# Patient Record
Sex: Male | Born: 1990 | Race: White | Hispanic: No | Marital: Single | Smoking: Current every day smoker
Health system: Southern US, Community
[De-identification: ages and names within clinical notes are randomized; demographics above are authoritative.]

## PROBLEM LIST (undated history)

## (undated) DIAGNOSIS — F329 Major depressive disorder, single episode, unspecified: Secondary | ICD-10-CM

## (undated) DIAGNOSIS — F32A Depression, unspecified: Secondary | ICD-10-CM

## (undated) DIAGNOSIS — F1210 Cannabis abuse, uncomplicated: Secondary | ICD-10-CM

## (undated) DIAGNOSIS — F419 Anxiety disorder, unspecified: Secondary | ICD-10-CM

## (undated) DIAGNOSIS — F1910 Other psychoactive substance abuse, uncomplicated: Secondary | ICD-10-CM

---

## 2016-12-07 ENCOUNTER — Encounter (HOSPITAL_BASED_OUTPATIENT_CLINIC_OR_DEPARTMENT_OTHER): Payer: Self-pay

## 2016-12-07 ENCOUNTER — Emergency Department (HOSPITAL_BASED_OUTPATIENT_CLINIC_OR_DEPARTMENT_OTHER)
Admission: EM | Admit: 2016-12-07 | Discharge: 2016-12-07 | Disposition: A | Discharge status: Home / Self Care | Payer: Self-pay | Attending: Dermatology | Admitting: Dermatology

## 2016-12-07 DIAGNOSIS — Z5321 Procedure and treatment not carried out due to patient leaving prior to being seen by health care provider: Secondary | ICD-10-CM | POA: Insufficient documentation

## 2016-12-07 DIAGNOSIS — R51 Headache: Secondary | ICD-10-CM | POA: Insufficient documentation

## 2016-12-07 DIAGNOSIS — F17200 Nicotine dependence, unspecified, uncomplicated: Secondary | ICD-10-CM | POA: Insufficient documentation

## 2016-12-07 HISTORY — DX: Cannabis abuse, uncomplicated: F12.10

## 2016-12-07 HISTORY — DX: Other psychoactive substance abuse, uncomplicated: F19.10

## 2016-12-07 NOTE — ED Triage Notes (Signed)
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

## 2016-12-07 NOTE — ED Notes (Signed)
Called out for patient and triage. No reply

## 2016-12-07 NOTE — ED Notes (Signed)
Pt not in ED WR 

## 2017-09-18 ENCOUNTER — Emergency Department (HOSPITAL_BASED_OUTPATIENT_CLINIC_OR_DEPARTMENT_OTHER): Payer: Self-pay

## 2017-09-18 ENCOUNTER — Encounter (HOSPITAL_BASED_OUTPATIENT_CLINIC_OR_DEPARTMENT_OTHER): Payer: Self-pay | Admitting: *Deleted

## 2017-09-18 ENCOUNTER — Emergency Department (HOSPITAL_BASED_OUTPATIENT_CLINIC_OR_DEPARTMENT_OTHER)
Admission: EM | Admit: 2017-09-18 | Discharge: 2017-09-18 | Disposition: A | Discharge status: Home / Self Care | Payer: Self-pay | Attending: Emergency Medicine | Admitting: Emergency Medicine

## 2017-09-18 ENCOUNTER — Other Ambulatory Visit: Payer: Self-pay

## 2017-09-18 DIAGNOSIS — F1910 Other psychoactive substance abuse, uncomplicated: Secondary | ICD-10-CM | POA: Insufficient documentation

## 2017-09-18 DIAGNOSIS — F17200 Nicotine dependence, unspecified, uncomplicated: Secondary | ICD-10-CM | POA: Insufficient documentation

## 2017-09-18 DIAGNOSIS — R4182 Altered mental status, unspecified: Secondary | ICD-10-CM | POA: Insufficient documentation

## 2017-09-18 HISTORY — DX: Anxiety disorder, unspecified: F41.9

## 2017-09-18 HISTORY — DX: Depression, unspecified: F32.A

## 2017-09-18 HISTORY — DX: Major depressive disorder, single episode, unspecified: F32.9

## 2017-09-18 LAB — CBC WITH DIFFERENTIAL/PLATELET
BASOS PCT: 0 %
Basophils Absolute: 0 10*3/uL (ref 0.0–0.1)
EOS ABS: 0.1 10*3/uL (ref 0.0–0.7)
Eosinophils Relative: 1 %
HEMATOCRIT: 43.2 % (ref 39.0–52.0)
Hemoglobin: 15.5 g/dL (ref 13.0–17.0)
Lymphocytes Relative: 29 %
Lymphs Abs: 3.8 10*3/uL (ref 0.7–4.0)
MCH: 32.6 pg (ref 26.0–34.0)
MCHC: 35.9 g/dL (ref 30.0–36.0)
MCV: 90.8 fL (ref 78.0–100.0)
MONO ABS: 1.7 10*3/uL — AB (ref 0.1–1.0)
MONOS PCT: 13 %
Neutro Abs: 7.3 10*3/uL (ref 1.7–7.7)
Neutrophils Relative %: 57 %
Platelets: 273 10*3/uL (ref 150–400)
RBC: 4.76 MIL/uL (ref 4.22–5.81)
RDW: 12.4 % (ref 11.5–15.5)
WBC: 12.9 10*3/uL — ABNORMAL HIGH (ref 4.0–10.5)

## 2017-09-18 LAB — BASIC METABOLIC PANEL
Anion gap: 8 (ref 5–15)
BUN: 22 mg/dL — AB (ref 6–20)
CALCIUM: 9.3 mg/dL (ref 8.9–10.3)
CO2: 22 mmol/L (ref 22–32)
CREATININE: 0.93 mg/dL (ref 0.61–1.24)
Chloride: 106 mmol/L (ref 101–111)
GFR calc Af Amer: >60 mL/min (ref >60)
GFR calc non Af Amer: >60 mL/min (ref >60)
Glucose, Bld: 96 mg/dL (ref 65–99)
Potassium: 4.1 mmol/L (ref 3.5–5.1)
SODIUM: 136 mmol/L (ref 135–145)

## 2017-09-18 LAB — HEPATIC FUNCTION PANEL
ALBUMIN: 4.1 g/dL (ref 3.5–5.0)
ALK PHOS: 83 U/L (ref 38–126)
ALT: 168 U/L — ABNORMAL HIGH (ref 17–63)
AST: 46 U/L — ABNORMAL HIGH (ref 15–41)
BILIRUBIN INDIRECT: 0.8 mg/dL (ref 0.3–0.9)
Bilirubin, Direct: 0.1 mg/dL (ref 0.1–0.5)
TOTAL PROTEIN: 7.4 g/dL (ref 6.5–8.1)
Total Bilirubin: 0.9 mg/dL (ref 0.3–1.2)

## 2017-09-18 LAB — SALICYLATE LEVEL: SALICYLATE LVL: 7.5 mg/dL (ref 2.8–30.0)

## 2017-09-18 LAB — RAPID URINE DRUG SCREEN, HOSP PERFORMED
Amphetamines: POSITIVE — AB
Barbiturates: NOT DETECTED
Benzodiazepines: POSITIVE — AB
Cocaine: NOT DETECTED
OPIATES: POSITIVE — AB
TETRAHYDROCANNABINOL: POSITIVE — AB

## 2017-09-18 LAB — ACETAMINOPHEN LEVEL

## 2017-09-18 LAB — TROPONIN I

## 2017-09-18 LAB — ETHANOL: Alcohol, Ethyl (B): <10 mg/dL (ref <10)

## 2017-09-18 MED ORDER — SODIUM CHLORIDE 0.9 % IV BOLUS (SEPSIS)
1000.0000 mL | Freq: Once | INTRAVENOUS | Status: AC
  Administered 2017-09-18: 1000 mL via INTRAVENOUS

## 2017-09-18 MED ORDER — ONDANSETRON HCL 4 MG/2ML IJ SOLN
4.0000 mg | Freq: Once | INTRAMUSCULAR | Status: AC
  Administered 2017-09-18: 4 mg via INTRAVENOUS
  Filled 2017-09-18: qty 2

## 2017-09-18 MED ORDER — DIPHENHYDRAMINE HCL 50 MG/ML IJ SOLN
50.0000 mg | Freq: Once | INTRAMUSCULAR | Status: AC
  Administered 2017-09-18: 50 mg via INTRAVENOUS
  Filled 2017-09-18: qty 1

## 2017-09-18 MED ORDER — METOCLOPRAMIDE HCL 5 MG/ML IJ SOLN
10.0000 mg | Freq: Once | INTRAMUSCULAR | Status: AC
  Administered 2017-09-18: 10 mg via INTRAVENOUS
  Filled 2017-09-18: qty 2

## 2017-09-18 NOTE — ED Notes (Signed)
Yellow socks and arm band applied, no changes, twitchy, restless, persistant dry cough, [facility], [facility]F bolus infusing.

## 2017-09-18 NOTE — ED Notes (Signed)
Attempting urine sample[facility] States, "feel about the same, chest isn't hurting as bad", continues to twitch & jerk, remains restless, fidgety, cooperative, polite, no dyspnea, persistant continuous cough remains.

## 2017-09-18 NOTE — ED Notes (Signed)
Attempting to give urine sample

## 2017-09-18 NOTE — ED Notes (Signed)
[facility] at [facility] BIB friends, assisted to w/c from car. Ataxic. Brought straight to exam room 11. Pt alert, NAD, restless, non-purposeful twitchy, uncoordinated jerking movements in all four extremities and trunk, fine red rash on trunk, interactive, speech clear, continuous clearing of throat, states, "think I had a seizure", no dyspnea, speaking in clear complete sentences. C/o CP. Admits to marijuana, also took a xanax and a pain pill given to him by his friends.

## 2017-09-18 NOTE — ED Provider Notes (Signed)
[facility]-EMERGENCY DEPT Provider Note: Lowella DellJ. Lane Jayland Null, MD, FACEP  CSN: [id number] MRN: [id number] ARRIVAL: 09/18/17 at 0517 ROOM: MHOTF/OTF   CHIEF COMPLAINT  Altered Mental Status   HISTORY OF PRESENT ILLNESS  09/18/17 5:40 AM Ethan Peterson is a 26 y.o. male with a history of substance abuse and possible seizure disorder resulting from a drug overdose several months ago.  He denies taking seizure medication.  He was complaining of a headache and vomited before he went to bed yesterday evening about 11 PM.  He was given an unspecified medication for his headache.  He was brought in by his girlfriend and another friend this morning he after he awoke complaining of intermittent chest pain associated with intermittent shortness of breath.  He is having difficulty describing the chest pain other than to say that it comes and goes and is fairly intense when it occurs.  It is associated with cough and nausea but no vomiting.  His girlfriend states that when they tried to get into the car he was very ataxic and requires significant assistance getting in and out of the car.  He was also confused earlier but his become oriented.  The patient states he thinks he may have had a seizure this morning that precipitated this behavior and chest pain.  He also complains of a generalized rash that has been present for several months. He admits to Xanax and marijuana use but denies heroin, oral opioid, methamphetamine or cocaine use.  He admits to one beer several hours ago.   Past Medical History:  Diagnosis Date  . Anxiety   . Depression   . IV drug abuse (HCC)   . Marijuana abuse     History reviewed. No pertinent surgical history.  History reviewed. No pertinent family history.  Social History   Tobacco Use  . Smoking status: Current Every Day Smoker  . Smokeless tobacco: Never Used  Substance Use Topics  . Alcohol use: Yes    Comment: occ  . Drug use: Yes    Types: Marijuana    Comment:  xanax    Prior to Admission medications   Not on File    Allergies Patient has no known allergies.   REVIEW OF SYSTEMS  Negative except as noted here or in the History of Present Illness.   PHYSICAL EXAMINATION  Initial Vital Signs Blood pressure 128/77, pulse (!) 101, resp. rate 18, height 6' (1.829 m), weight 81.6 kg (180 lb), SpO2 99 %.  Examination General: Well-developed, well-nourished male in no acute distress; appearance consistent with age of record HENT: normocephalic; atraumatic Eyes: pupils equal, round and reactive to light; extraocular muscles intact Neck: supple Heart: regular rate and rhythm; tachycardia Lungs: clear to auscultation bilaterally Abdomen: soft; nondistended; nontender; no masses or hepatosplenomegaly; bowel sounds present Extremities: No deformity; full range of motion; pulses normal Neurologic: Awake, alert and oriented x 4; ataxia; motor function intact in all extremities and symmetric; no facial droop Skin: Warm and dry; fine generalized mildly erythematous macular rash Psychiatric: Mildly agitated   RESULTS  Summary of this visit's results, reviewed by myself:   EKG Interpretation  Date/Time:  Monday September 18 2017 05:21:14 EST Ventricular Rate:  118 PR Interval:    QRS Duration: 90 QT Interval:  304 QTC Calculation: 426 R Axis:   63 Text Interpretation:  Sinus tachycardia Biatrial enlargement RSR' in V1 or V2, right VCD or RVH No previous ECGs available Confirmed by Verland Sprinkle ([id number]) on 09/18/2017 5:34:17 AM  Laboratory Studies: Results for orders placed or performed during the hospital encounter of 09/18/17 (from the past 24 hour(s))  CBC with Differential/Platelet     Status: Abnormal   Collection Time: 09/18/17  5:30 AM  Result Value Ref Range   WBC 12.9 (H) 4.0 - 10.5 K/uL   RBC 4.76 4.22 - 5.81 MIL/uL   Hemoglobin 15.5 13.0 - 17.0 g/dL   HCT 16.1 09.6 - 04.5 %   MCV 90.8 78.0 - 100.0 fL   MCH 32.6 26.0 - 34.0  pg   MCHC 35.9 30.0 - 36.0 g/dL   RDW 40.9 81.1 - 91.4 %   Platelets 273 150 - 400 K/uL   Neutrophils Relative % 57 %   Neutro Abs 7.3 1.7 - 7.7 K/uL   Lymphocytes Relative 29 %   Lymphs Abs 3.8 0.7 - 4.0 K/uL   Monocytes Relative 13 %   Monocytes Absolute 1.7 (H) 0.1 - 1.0 K/uL   Eosinophils Relative 1 %   Eosinophils Absolute 0.1 0.0 - 0.7 K/uL   Basophils Relative 0 %   Basophils Absolute 0.0 0.0 - 0.1 K/uL  Basic metabolic panel     Status: Abnormal   Collection Time: 09/18/17  5:30 AM  Result Value Ref Range   Sodium 136 135 - 145 mmol/L   Potassium 4.1 3.5 - 5.1 mmol/L   Chloride 106 101 - 111 mmol/L   CO2 22 22 - 32 mmol/L   Glucose, Bld 96 65 - 99 mg/dL   BUN 22 (H) 6 - 20 mg/dL   Creatinine, Ser 7.82 0.61 - 1.24 mg/dL   Calcium 9.3 8.9 - 95.6 mg/dL   GFR calc non Af Amer >60 >60 mL/min   GFR calc Af Amer >60 >60 mL/min   Anion gap 8 5 - 15  Ethanol     Status: None   Collection Time: 09/18/17  5:30 AM  Result Value Ref Range   Alcohol, Ethyl (B) <10 <10 mg/dL  Troponin I     Status: None   Collection Time: 09/18/17  5:30 AM  Result Value Ref Range   Troponin I <0.03 <0.03 ng/mL  Hepatic function panel     Status: Abnormal   Collection Time: 09/18/17  7:33 AM  Result Value Ref Range   Total Protein 7.4 6.5 - 8.1 g/dL   Albumin 4.1 3.5 - 5.0 g/dL   AST 46 (H) 15 - 41 U/L   ALT 168 (H) 17 - 63 U/L   Alkaline Phosphatase 83 38 - 126 U/L   Total Bilirubin 0.9 0.3 - 1.2 mg/dL   Bilirubin, Direct 0.1 0.1 - 0.5 mg/dL   Indirect Bilirubin 0.8 0.3 - 0.9 mg/dL  Salicylate level     Status: None   Collection Time: 09/18/17  7:33 AM  Result Value Ref Range   Salicylate Lvl 7.5 2.8 - 30.0 mg/dL  Acetaminophen level     Status: Abnormal   Collection Time: 09/18/17  7:33 AM  Result Value Ref Range   Acetaminophen (Tylenol), Serum <10 (L) 10 - 30 ug/mL  Rapid urine drug screen (hospital performed)     Status: Abnormal   Collection Time: 09/18/17 10:25 AM  Result Value  Ref Range   Opiates POSITIVE (A) NONE DETECTED   Cocaine NONE DETECTED NONE DETECTED   Benzodiazepines POSITIVE (A) NONE DETECTED   Amphetamines POSITIVE (A) NONE DETECTED   Tetrahydrocannabinol POSITIVE (A) NONE DETECTED   Barbiturates NONE DETECTED NONE DETECTED   Imaging Studies: Ct Head  Wo Contrast  Result Date: 09/18/2017 CLINICAL DATA:  26 year old male with altered mental status. EXAM: CT HEAD WITHOUT CONTRAST TECHNIQUE: Contiguous axial images were obtained from the base of the skull through the vertex without intravenous contrast. COMPARISON:  None. FINDINGS: Evaluation of this exam is limited due to motion artifact. Brain: The ventricles and sulci appropriate size for patient's age. There is no acute intracranial hemorrhage. No mass effect or midline shift. No extra-axial fluid collection. Vascular: Choose high attenuation of the intracranial vasculature most consistent with hemoconcentration. Skull: Normal. Negative for fracture or focal lesion. Sinuses/Orbits: There is mucoperiosteal thickening of the left maxillary sinus. The remainder of the visualized paranasal sinuses and mastoid air cells are clear. Other: None IMPRESSION: No definite acute intracranial pathology on this motion degraded this study. Electronically Signed   By: Elgie CollardArash  Radparvar M.D.   On: 09/18/2017 06:08    ED COURSE  Nursing notes and initial vitals signs, including pulse oximetry, reviewed.  Vitals:   09/18/17 0750 09/18/17 0830 09/18/17 0930 09/18/17 1027  BP: 108/73 (!) 145/56 105/62 131/86  Pulse:    85  Resp: (!) 22 20 13 18   Temp:      TempSrc:      SpO2:    98%  Weight:      Height:        PROCEDURES    ED DIAGNOSES     ICD-10-CM   1. Altered mental status, unspecified altered mental status type R41.82   2. Polysubstance abuse (HCC) F19.10        Delray Reza, MD 09/18/17 2248

## 2017-09-18 NOTE — ED Provider Notes (Signed)
Strict return and follow-up instructions reviewed. She/He expressed understanding of all discharge instructions and felt comfortable with the plan of care.  26 year old male who presents with altered mental status.  On my evaluation he is alert, oriented, but with some psychomotor agitation.  Does endorse substance abuse, which I feel is likely the etiology of his symptoms.  Blood work including tox screen is unremarkable.  However UDS does show opiates, benzodiazepines, amphetamines, and marijuana in his system.  His vital signs normalized after observation in the ED and IV fluids.  He did receive Reglan and Benadryl for nausea and agitation.  On my reevaluation, he reports feeling better.  His significant other states that he is returning to normal.  CT had also reviewed from earlier today and shows no acute intracranial processes.  He has provided resources for rehab/detox facilities.  He does state that he was told that he might have seizures when he was in his correctional facility.  No active seizures today.  Per his request, he will be given neurology follow-up for seizure workup as needed.  I do feel that he is well to be discharged home. Strict return and follow-up instructions reviewed. He expressed understanding of all discharge instructions and felt comfortable with the plan of care.    Lavera GuiseLiu, Dana Duo, MD 09/18/17 [phone]

## 2017-09-18 NOTE — Discharge Instructions (Signed)
Your symptoms today are felt to be due to substance abuse.   You are given resource guides for rehab and detox facilities. You are medically cleared today in the ED.  Please establish a primary care provider for follow-up. You are given neurology contact information for work-up of seizures.  Please return for worsening symptoms, including worsening confusion, inability to walk, intractable vomiting or any other symptoms concerning ot you.

## 2017-09-18 NOTE — ED Notes (Signed)
[facility] unable to void at this time.

## 2017-09-18 NOTE — ED Notes (Signed)
Patient is resting comfortably, with eyes closed. Resp even and unlabored, no distress noted

## 2017-09-18 NOTE — ED Notes (Signed)
No changes, alert, NAD, no dyspnea noted, skin W&D, interactive, cooperative, apologetic, restless, unable to give urine sample at this time after valid attempt, to CT via stretcher.

## 2018-10-16 IMAGING — CT CT HEAD W/O CM
3 of 6 series · 16 of 47 positions shown, 19 images · non-contrast
Comparison: None.

CLINICAL DATA: 25-year-old male with altered mental status.

EXAM:
CT HEAD WITHOUT CONTRAST
TECHNIQUE: Contiguous axial images were obtained from the base of the skull
through the vertex without intravenous contrast.

[Series 2: head wo · axial · 0.51mm/px · z∈[-176,-36]mm · 11 of 34 slices shown, 14 images]
[im 3/34  brain]
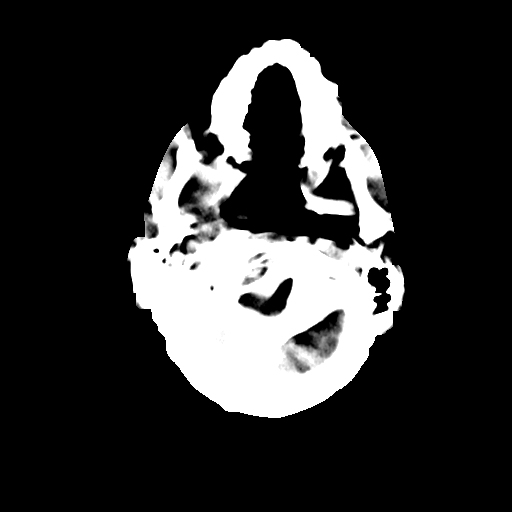
[im 3/34  bone]
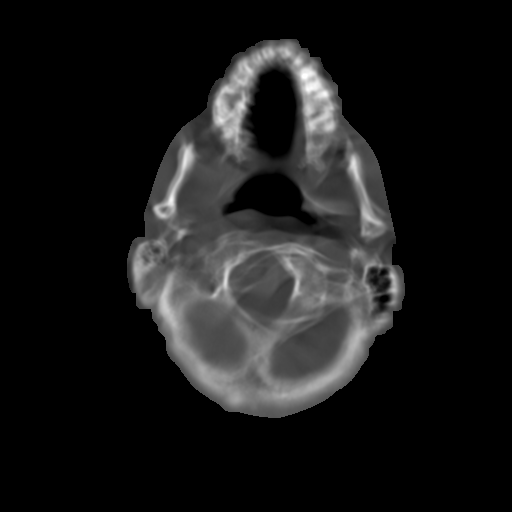
[im 5/34  brain]
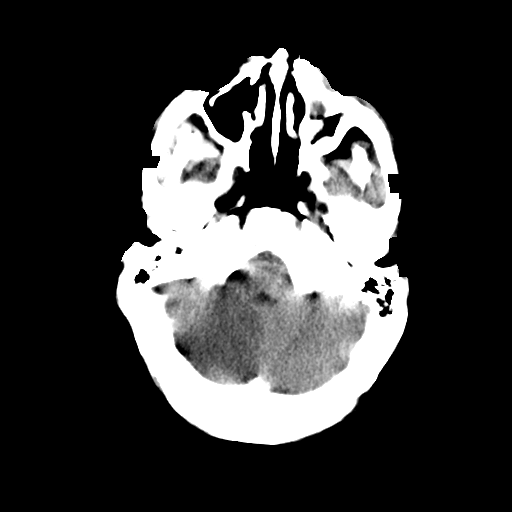
[im 8/34  brain]
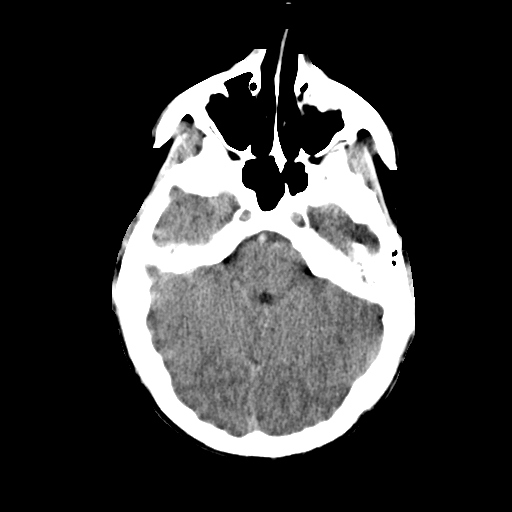
[im 12/34  brain]
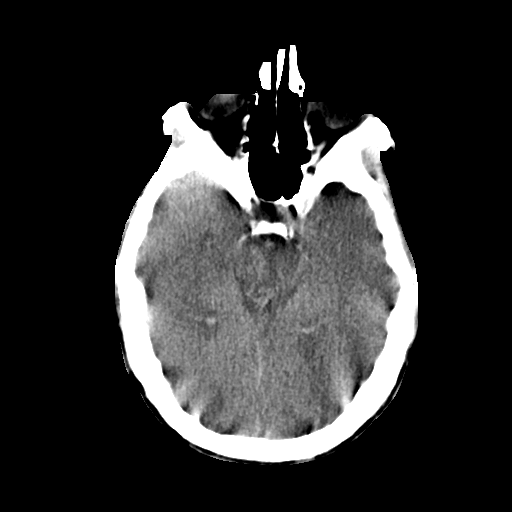
[im 15/34  brain]
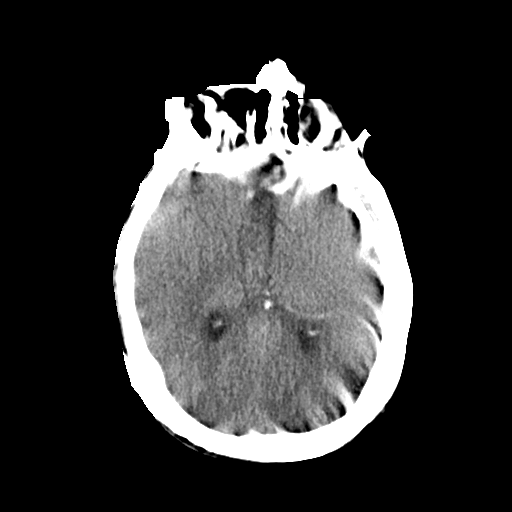
[im 15/34  bone]
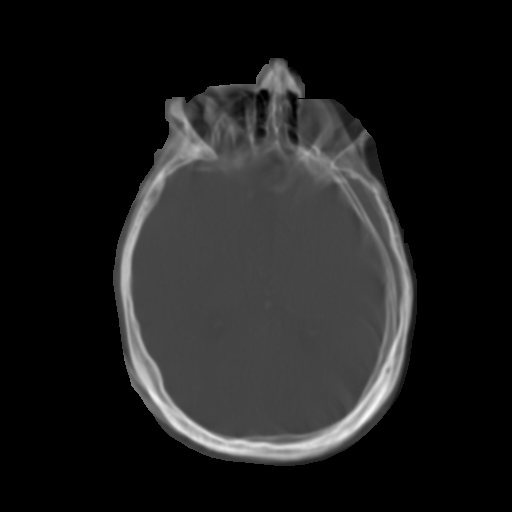
[im 17/34  brain]
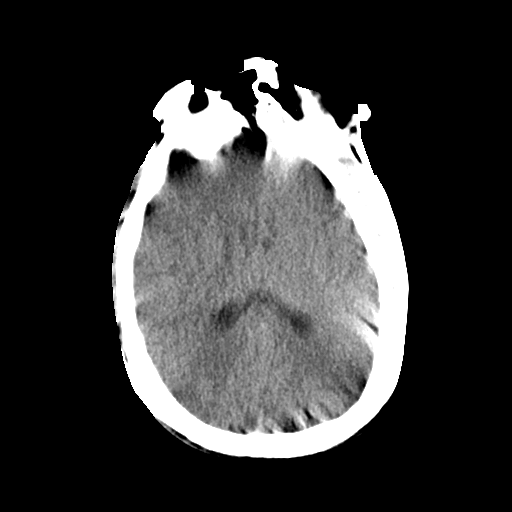
[im 19/34  brain]
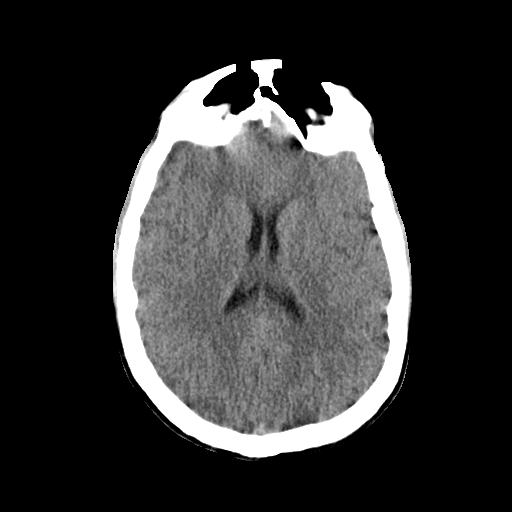
[im 22/34  brain]
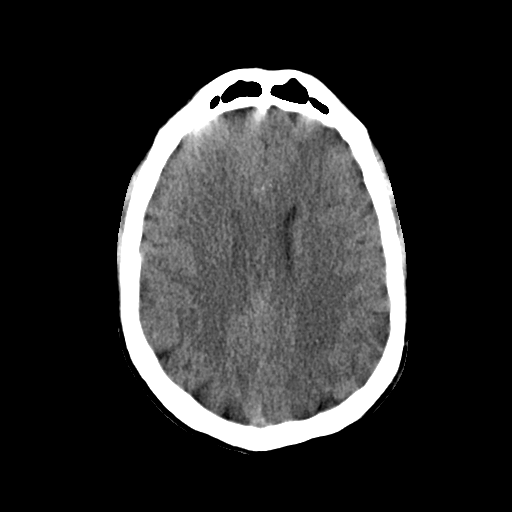
[im 26/34  brain]
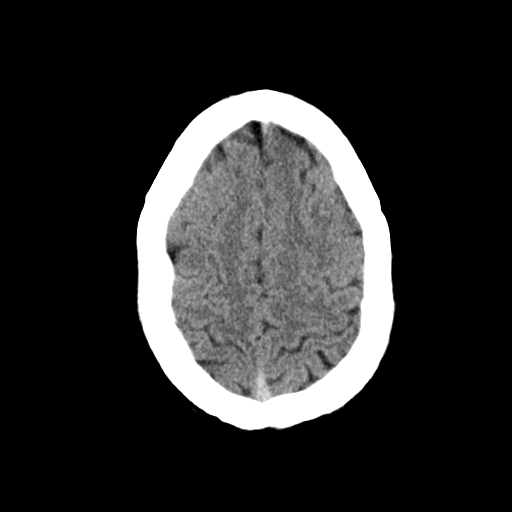
[im 26/34  bone]
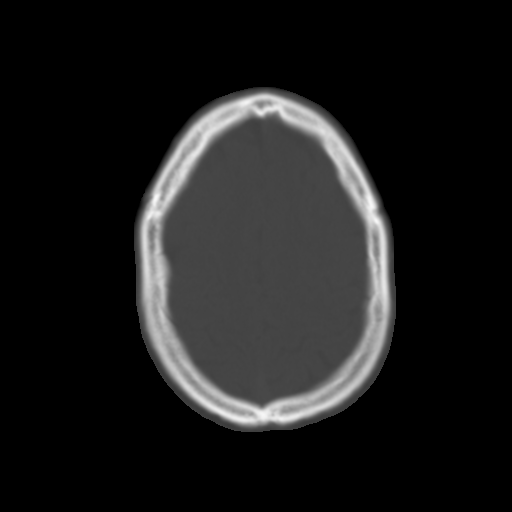
[im 29/34  brain]
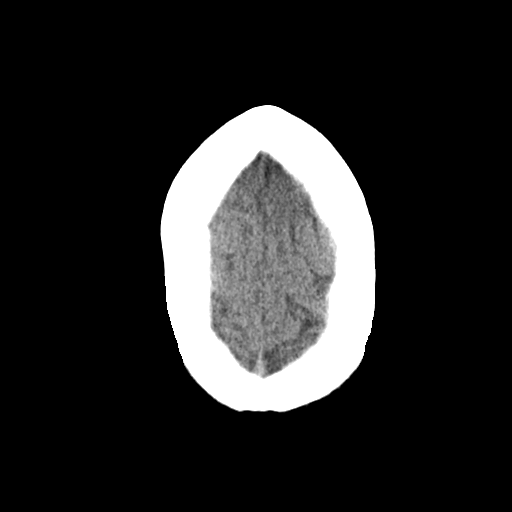
[im 31/34  brain]
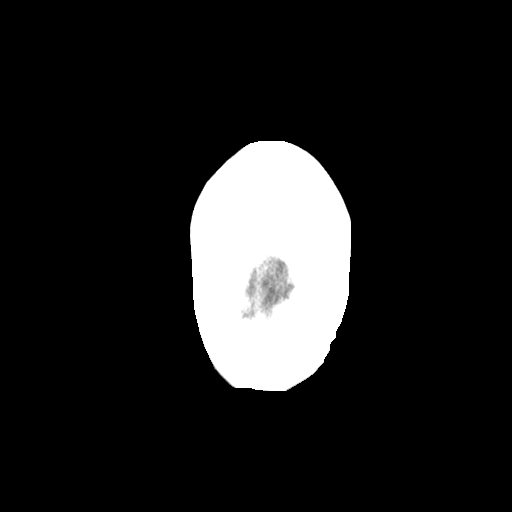

[Series 4: coronal soft · coronal · 0.37mm/px · 3 of 72 slices shown]
[im 17/72  brain]
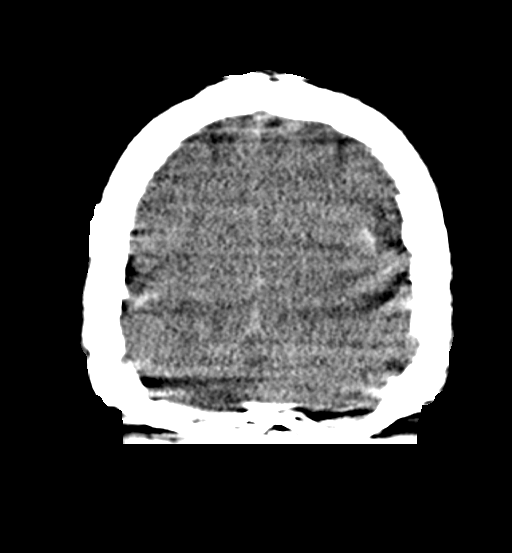
[im 34/72  brain]
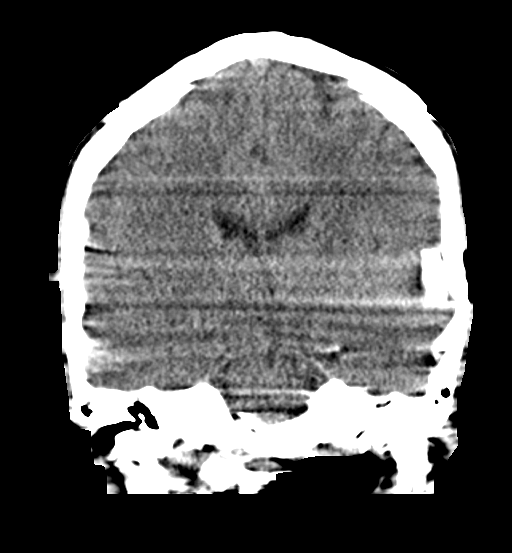
[im 51/72  brain]
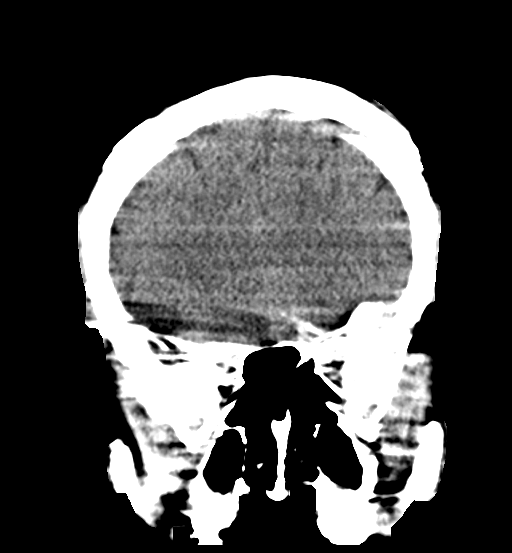

[Series 5: sag soft · sagittal · 0.38mm/px · 2 of 54 slices shown]
[im 18/54  brain]
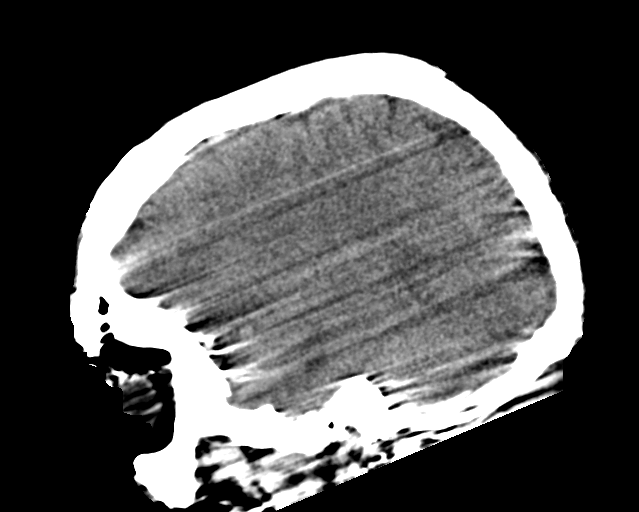
[im 36/54  brain]
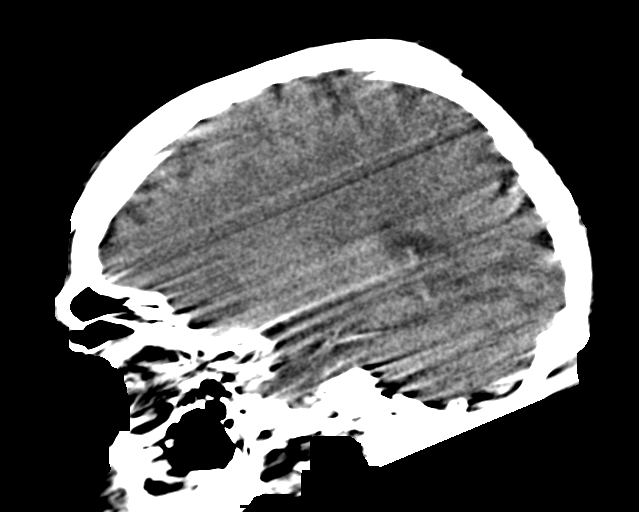

[16 of 47 positions shown; findings below may reference images not displayed]

FINDINGS: Evaluation of this exam is limited due to motion artifact.

Brain: The ventricles and sulci appropriate size for patient's age.
There is no acute intracranial hemorrhage. No mass effect or midline
shift. No extra-axial fluid collection.

Vascular: Choose high attenuation of the intracranial vasculature
most consistent with hemoconcentration.

Skull: Normal. Negative for fracture or focal lesion.

Sinuses/Orbits: There is mucoperiosteal thickening of the left
maxillary sinus. The remainder of the visualized paranasal sinuses
and mastoid air cells are clear.

Other: None
IMPRESSION: No definite acute intracranial pathology on this motion degraded
this study.
